# Patient Record
Sex: Female | Born: 1985 | Race: Black or African American | Hispanic: No | Marital: Single | Smoking: Current every day smoker
Health system: Southern US, Community
[De-identification: ages and names within clinical notes are randomized; demographics above are authoritative.]

## PROBLEM LIST (undated history)

## (undated) ENCOUNTER — Inpatient Hospital Stay (HOSPITAL_COMMUNITY): Payer: Self-pay

## (undated) DIAGNOSIS — A749 Chlamydial infection, unspecified: Secondary | ICD-10-CM

## (undated) DIAGNOSIS — J40 Bronchitis, not specified as acute or chronic: Secondary | ICD-10-CM

## (undated) DIAGNOSIS — R011 Cardiac murmur, unspecified: Secondary | ICD-10-CM

## (undated) DIAGNOSIS — A549 Gonococcal infection, unspecified: Secondary | ICD-10-CM

## (undated) DIAGNOSIS — O24419 Gestational diabetes mellitus in pregnancy, unspecified control: Secondary | ICD-10-CM

## (undated) HISTORY — PX: NO PAST SURGERIES: SHX2092

---
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
## 2003-01-02 ENCOUNTER — Inpatient Hospital Stay (HOSPITAL_COMMUNITY): Admission: AD | Admit: 2003-01-02 | Discharge: 2003-01-02 | Payer: Self-pay | Admitting: *Deleted

## 2003-01-02 ENCOUNTER — Emergency Department (HOSPITAL_COMMUNITY): Admission: EM | Admit: 2003-01-02 | Discharge: 2003-01-02 | Payer: Self-pay | Admitting: Emergency Medicine

## 2003-01-02 ENCOUNTER — Encounter: Payer: Self-pay | Admitting: Emergency Medicine

## 2003-06-10 ENCOUNTER — Emergency Department (HOSPITAL_COMMUNITY): Admission: EM | Admit: 2003-06-10 | Discharge: 2003-06-10 | Payer: Self-pay | Admitting: Emergency Medicine

## 2003-07-25 ENCOUNTER — Ambulatory Visit (HOSPITAL_COMMUNITY): Admission: RE | Admit: 2003-07-25 | Discharge: 2003-07-25 | Payer: Self-pay | Admitting: Obstetrics and Gynecology

## 2003-09-18 ENCOUNTER — Emergency Department (HOSPITAL_COMMUNITY): Admission: EM | Admit: 2003-09-18 | Discharge: 2003-09-19 | Payer: Self-pay | Admitting: Emergency Medicine

## 2003-10-08 ENCOUNTER — Emergency Department (HOSPITAL_COMMUNITY): Admission: EM | Admit: 2003-10-08 | Discharge: 2003-10-08 | Payer: Self-pay | Admitting: Emergency Medicine

## 2003-10-26 ENCOUNTER — Inpatient Hospital Stay (HOSPITAL_COMMUNITY): Admission: AD | Admit: 2003-10-26 | Discharge: 2003-10-27 | Payer: Self-pay | Admitting: Obstetrics & Gynecology

## 2003-11-11 ENCOUNTER — Inpatient Hospital Stay (HOSPITAL_COMMUNITY): Admission: AD | Admit: 2003-11-11 | Discharge: 2003-11-11 | Payer: Self-pay | Admitting: *Deleted

## 2003-11-16 ENCOUNTER — Inpatient Hospital Stay (HOSPITAL_COMMUNITY): Admission: AD | Admit: 2003-11-16 | Discharge: 2003-11-16 | Payer: Self-pay | Admitting: Obstetrics and Gynecology

## 2003-12-02 ENCOUNTER — Encounter: Admission: RE | Admit: 2003-12-02 | Discharge: 2003-12-02 | Payer: Self-pay | Admitting: *Deleted

## 2003-12-02 ENCOUNTER — Ambulatory Visit (HOSPITAL_COMMUNITY): Admission: RE | Admit: 2003-12-02 | Discharge: 2003-12-02 | Payer: Self-pay | Admitting: *Deleted

## 2003-12-04 ENCOUNTER — Inpatient Hospital Stay (HOSPITAL_COMMUNITY): Admission: AD | Admit: 2003-12-04 | Discharge: 2003-12-04 | Payer: Self-pay | Admitting: Obstetrics and Gynecology

## 2003-12-06 ENCOUNTER — Inpatient Hospital Stay (HOSPITAL_COMMUNITY): Admission: AD | Admit: 2003-12-06 | Discharge: 2003-12-08 | Payer: Self-pay | Admitting: Family Medicine

## 2004-04-18 ENCOUNTER — Emergency Department (HOSPITAL_COMMUNITY): Admission: EM | Admit: 2004-04-18 | Discharge: 2004-04-18 | Payer: Self-pay | Admitting: Emergency Medicine

## 2004-06-20 ENCOUNTER — Emergency Department (HOSPITAL_COMMUNITY): Admission: EM | Admit: 2004-06-20 | Discharge: 2004-06-20 | Payer: Self-pay | Admitting: Emergency Medicine

## 2004-12-19 ENCOUNTER — Emergency Department (HOSPITAL_COMMUNITY): Admission: EM | Admit: 2004-12-19 | Discharge: 2004-12-19 | Payer: Self-pay | Admitting: Emergency Medicine

## 2006-01-21 ENCOUNTER — Inpatient Hospital Stay (HOSPITAL_COMMUNITY): Admission: AD | Admit: 2006-01-21 | Discharge: 2006-01-21 | Payer: Self-pay | Admitting: Family Medicine

## 2006-03-24 ENCOUNTER — Inpatient Hospital Stay (HOSPITAL_COMMUNITY): Admission: AD | Admit: 2006-03-24 | Discharge: 2006-03-25 | Payer: Self-pay | Admitting: Gynecology

## 2006-04-20 ENCOUNTER — Inpatient Hospital Stay (HOSPITAL_COMMUNITY): Admission: AD | Admit: 2006-04-20 | Discharge: 2006-04-20 | Payer: Self-pay | Admitting: Gynecology

## 2006-05-16 ENCOUNTER — Ambulatory Visit (HOSPITAL_COMMUNITY): Admission: RE | Admit: 2006-05-16 | Discharge: 2006-05-16 | Payer: Self-pay | Admitting: Gynecology

## 2006-06-02 ENCOUNTER — Ambulatory Visit: Payer: Self-pay | Admitting: Obstetrics & Gynecology

## 2006-06-11 ENCOUNTER — Ambulatory Visit: Payer: Self-pay | Admitting: Obstetrics and Gynecology

## 2006-07-03 ENCOUNTER — Ambulatory Visit: Payer: Self-pay | Admitting: Obstetrics & Gynecology

## 2006-07-10 ENCOUNTER — Ambulatory Visit: Payer: Self-pay | Admitting: *Deleted

## 2006-07-21 ENCOUNTER — Ambulatory Visit: Payer: Self-pay | Admitting: *Deleted

## 2006-08-11 ENCOUNTER — Ambulatory Visit: Payer: Self-pay | Admitting: Obstetrics & Gynecology

## 2006-08-19 ENCOUNTER — Ambulatory Visit: Payer: Self-pay | Admitting: Obstetrics and Gynecology

## 2006-08-19 ENCOUNTER — Inpatient Hospital Stay (HOSPITAL_COMMUNITY): Admission: AD | Admit: 2006-08-19 | Discharge: 2006-08-21 | Payer: Self-pay | Admitting: Gynecology

## 2007-02-20 ENCOUNTER — Emergency Department (HOSPITAL_COMMUNITY): Admission: EM | Admit: 2007-02-20 | Discharge: 2007-02-20 | Payer: Self-pay | Admitting: Family Medicine

## 2007-04-17 ENCOUNTER — Emergency Department (HOSPITAL_COMMUNITY): Admission: EM | Admit: 2007-04-17 | Discharge: 2007-04-17 | Payer: Self-pay | Admitting: Emergency Medicine

## 2007-06-29 ENCOUNTER — Emergency Department (HOSPITAL_COMMUNITY): Admission: EM | Admit: 2007-06-29 | Discharge: 2007-06-29 | Payer: Self-pay | Admitting: Family Medicine

## 2007-07-19 ENCOUNTER — Emergency Department (HOSPITAL_COMMUNITY): Admission: EM | Admit: 2007-07-19 | Discharge: 2007-07-19 | Payer: Self-pay | Admitting: Emergency Medicine

## 2007-08-15 ENCOUNTER — Inpatient Hospital Stay (HOSPITAL_COMMUNITY): Admission: AD | Admit: 2007-08-15 | Discharge: 2007-08-15 | Payer: Self-pay | Admitting: Obstetrics & Gynecology

## 2007-11-03 ENCOUNTER — Emergency Department (HOSPITAL_COMMUNITY): Admission: EM | Admit: 2007-11-03 | Discharge: 2007-11-03 | Payer: Self-pay | Admitting: Emergency Medicine

## 2007-11-27 ENCOUNTER — Ambulatory Visit: Payer: Self-pay | Admitting: Nurse Practitioner

## 2007-11-27 DIAGNOSIS — F17200 Nicotine dependence, unspecified, uncomplicated: Secondary | ICD-10-CM | POA: Insufficient documentation

## 2007-11-27 DIAGNOSIS — M25569 Pain in unspecified knee: Secondary | ICD-10-CM

## 2007-11-30 ENCOUNTER — Ambulatory Visit: Payer: Self-pay | Admitting: Nurse Practitioner

## 2008-05-06 ENCOUNTER — Emergency Department (HOSPITAL_COMMUNITY): Admission: EM | Admit: 2008-05-06 | Discharge: 2008-05-06 | Payer: Self-pay | Admitting: Family Medicine

## 2009-08-15 IMAGING — US US OB COMP LESS 14 WK
1 series · 14 of 28 positions shown · non-contrast
Comparison: none

CLINICAL DATA: 20-year-old female with stomach pain, pregnant. 
 OBSTETRICAL ULTRASOUND <14 WKS AND TRANSVAGINAL OB US:
TECHNIQUE: Both transabdominal and transvaginal ultrasound examinations were performed for complete evaluation of the gestation as well as the maternal uterus, adnexal regions, and pelvic cul-de-sac.

[Series 1: unknown · 0.32mm/px · 14 of 62 slices shown]
[im 3/62]
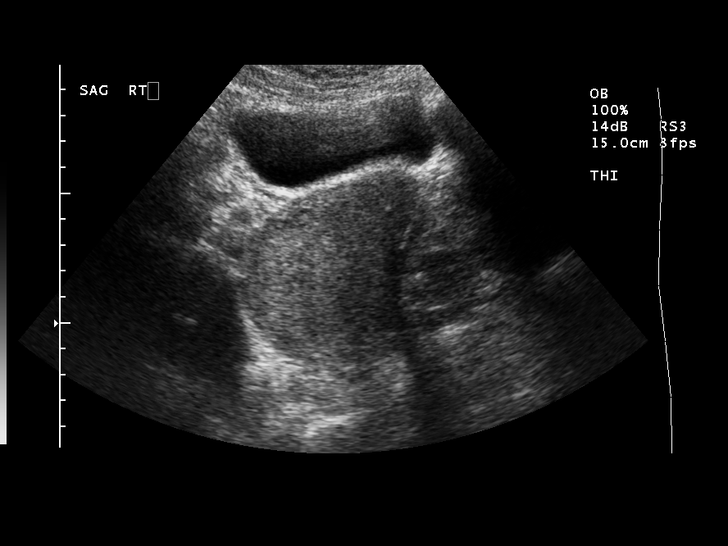
[im 7/62]
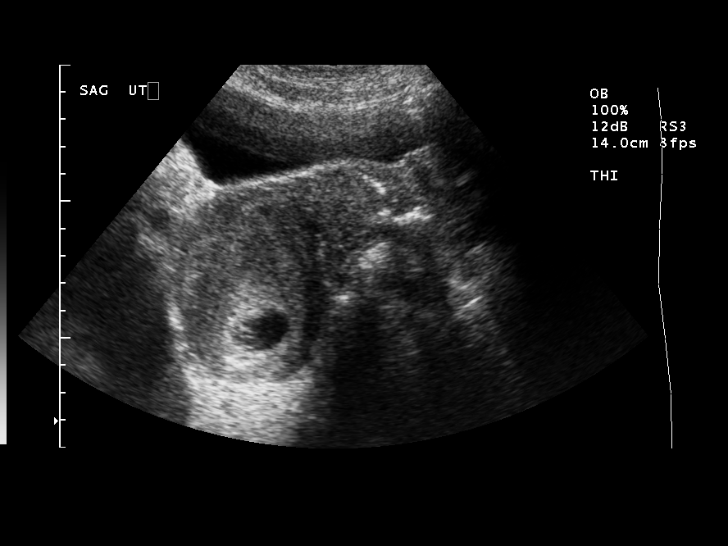
[im 12/62]
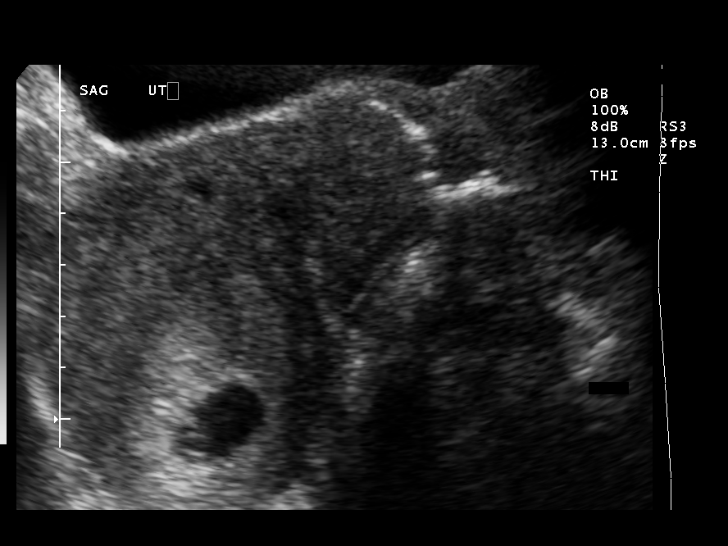
[im 16/62]
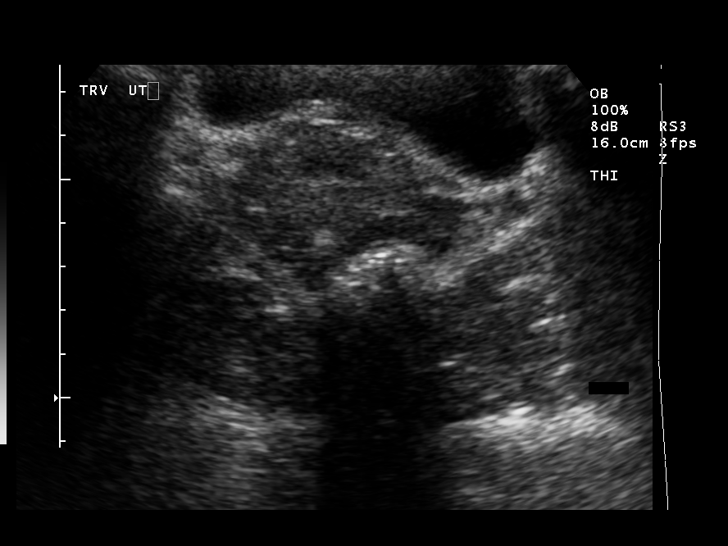
[im 21/62]
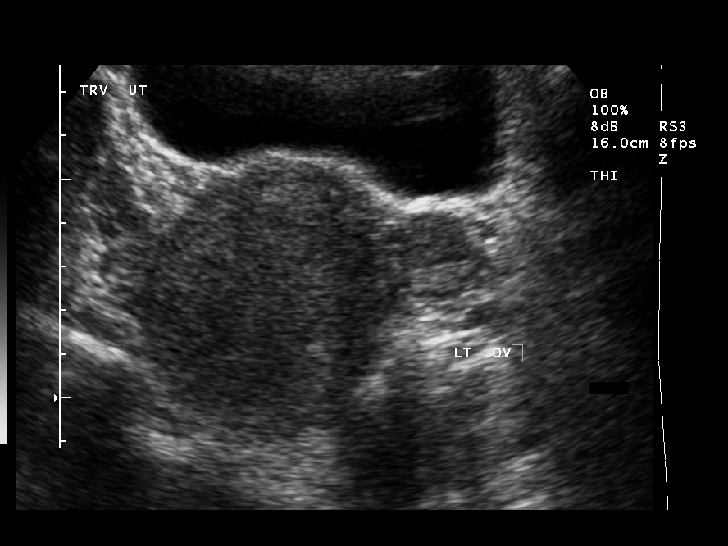
[im 25/62]
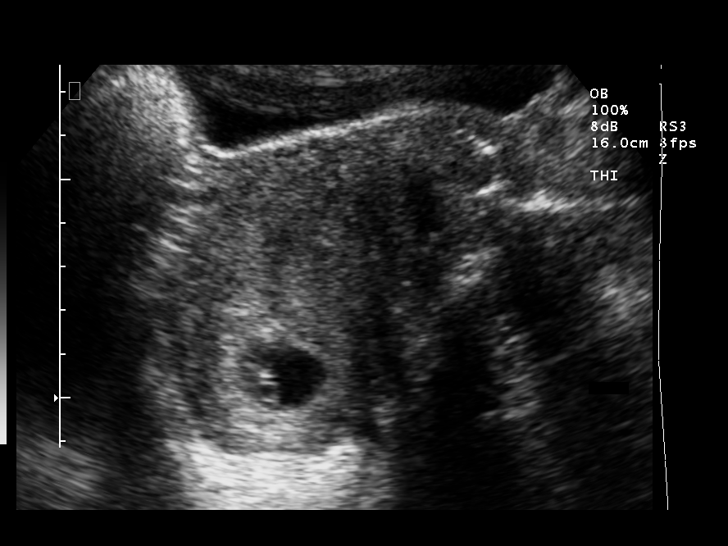
[im 30/62]
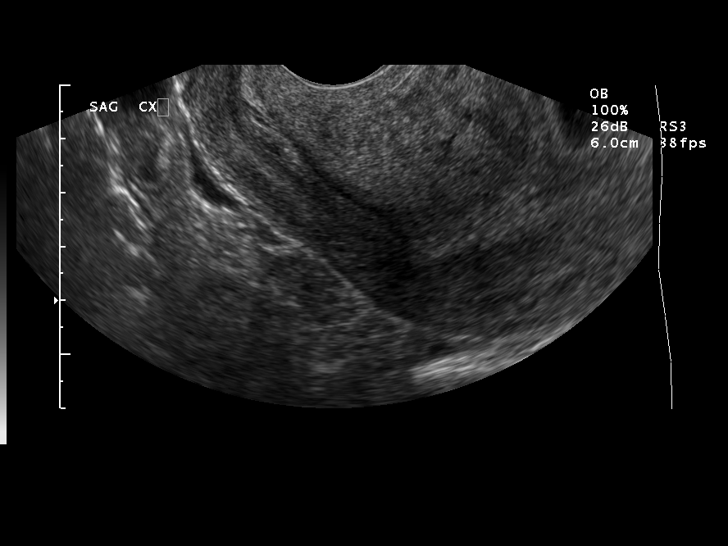
[im 34/62]
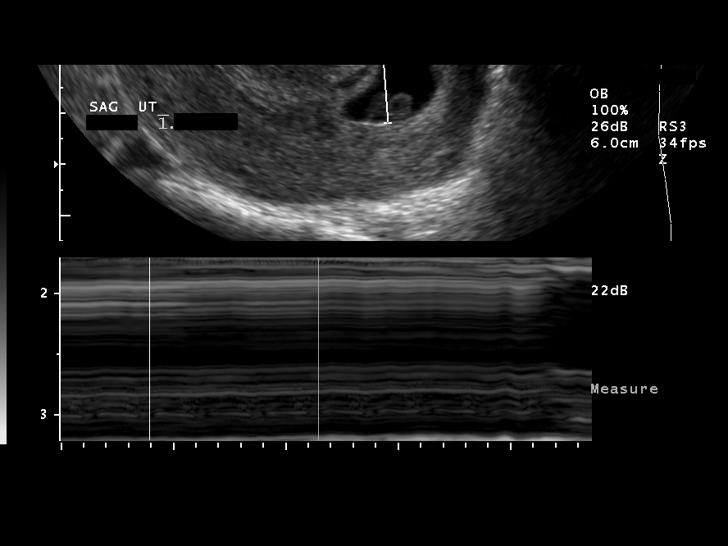
[im 39/62]
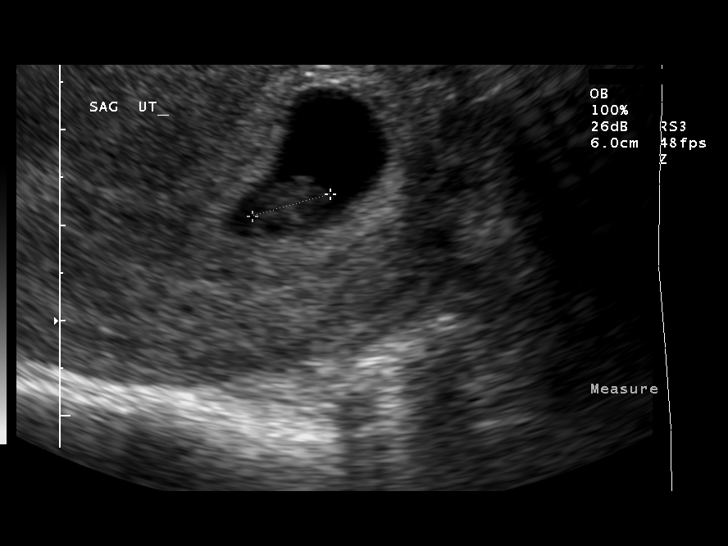
[im 43/62]
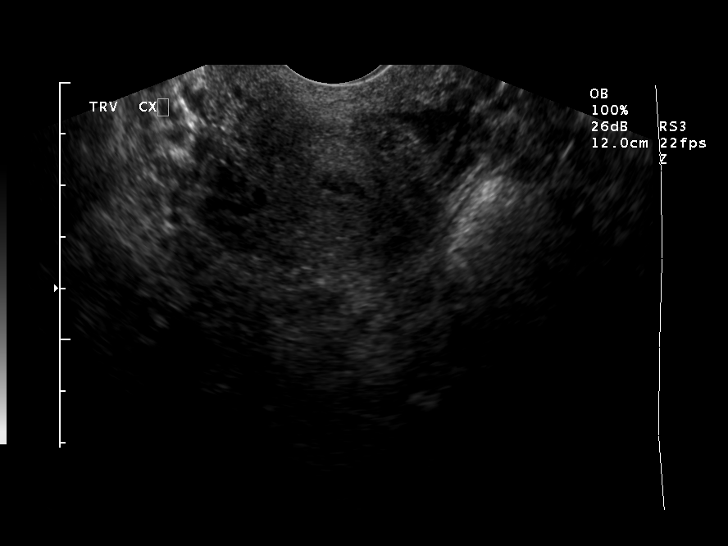
[im 48/62]
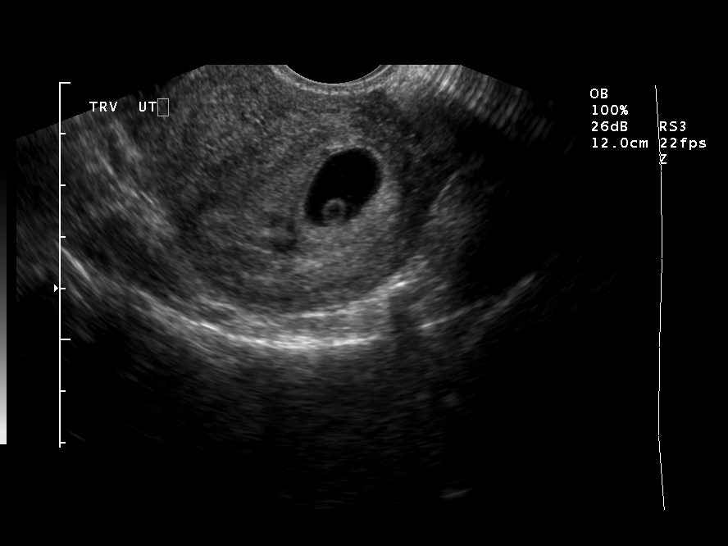
[im 52/62]
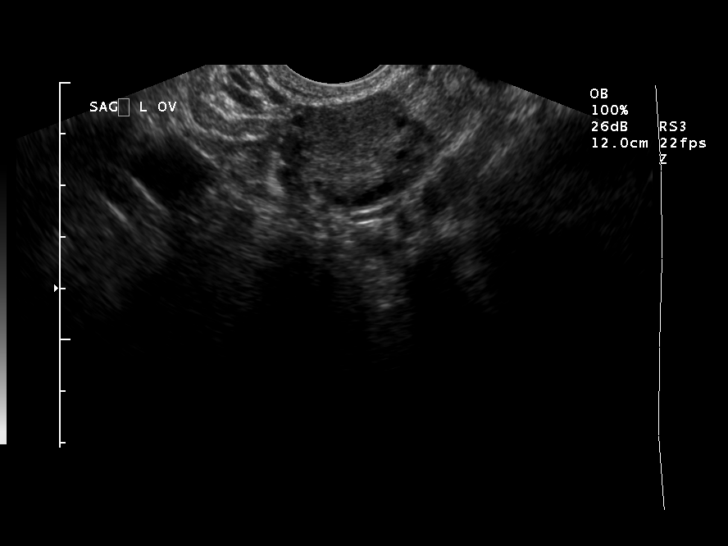
[im 57/62]
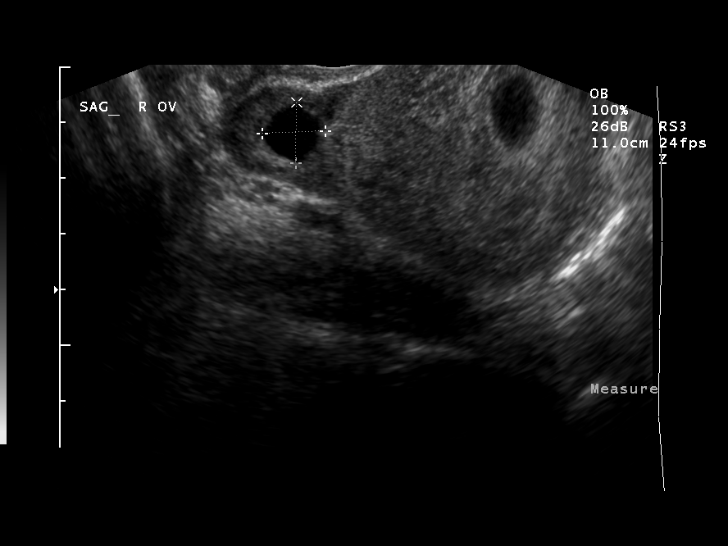
[im 62/62]
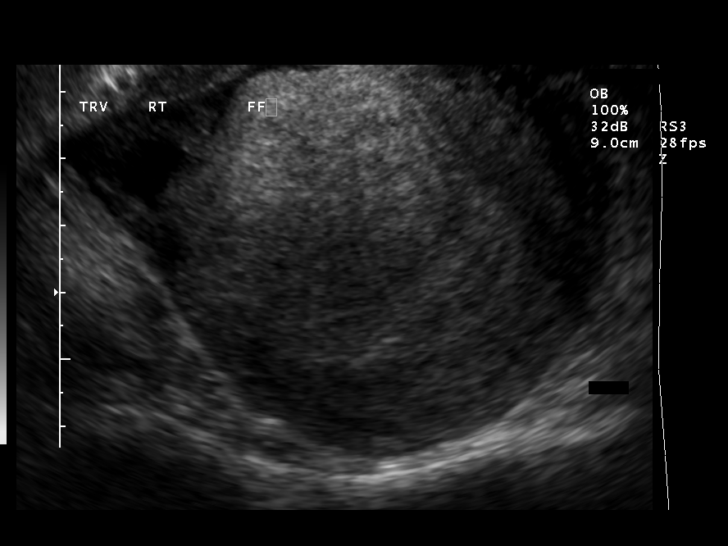

[14 of 28 positions shown; findings below may reference images not displayed]

FINDINGS: There is a single intrauterine gestational sac with a single live embryo.  There is a yolk sac present.  There is cardiac activity present with heart rate equal 121 beats per minute.  
 Crown rump length equals 8.0 mm, which calculates to an estimated gestational age of 6 weeks 5 days.  
 No evidence of subchorionic hemorrhage. 
 There is a small cyst in the right ovary measuring 1.1 cm consistent with corpus luteal cyst. 
 Left ovary is normal. 
 Trace amount of free fluid in the anterior cul-de-sac on the right.
IMPRESSION: Single live intrauterine gestational sac with live embryo and normal cardiac activity.

## 2010-07-06 ENCOUNTER — Emergency Department (HOSPITAL_COMMUNITY)
Admission: EM | Admit: 2010-07-06 | Discharge: 2010-07-06 | Payer: Self-pay | Source: Home / Self Care | Admitting: Family Medicine

## 2010-08-26 ENCOUNTER — Encounter: Payer: Self-pay | Admitting: *Deleted

## 2010-10-11 ENCOUNTER — Encounter (HOSPITAL_COMMUNITY): Payer: Self-pay | Admitting: Radiology

## 2010-10-11 ENCOUNTER — Inpatient Hospital Stay (HOSPITAL_COMMUNITY): Payer: Medicaid Other

## 2010-10-11 ENCOUNTER — Inpatient Hospital Stay (HOSPITAL_COMMUNITY)
Admission: AD | Admit: 2010-10-11 | Discharge: 2010-10-11 | Disposition: A | Discharge status: Home / Self Care | Payer: Medicaid Other | Source: Ambulatory Visit | Attending: Obstetrics & Gynecology | Admitting: Obstetrics & Gynecology

## 2010-10-11 DIAGNOSIS — O99891 Other specified diseases and conditions complicating pregnancy: Secondary | ICD-10-CM | POA: Insufficient documentation

## 2010-10-11 DIAGNOSIS — R109 Unspecified abdominal pain: Secondary | ICD-10-CM

## 2010-10-11 DIAGNOSIS — M533 Sacrococcygeal disorders, not elsewhere classified: Secondary | ICD-10-CM | POA: Insufficient documentation

## 2010-10-11 DIAGNOSIS — O9989 Other specified diseases and conditions complicating pregnancy, childbirth and the puerperium: Secondary | ICD-10-CM

## 2010-10-11 LAB — COMPREHENSIVE METABOLIC PANEL
ALT: 15 U/L (ref 0–35)
Albumin: 3.6 g/dL (ref 3.5–5.2)
CO2: 24 mEq/L (ref 19–32)
Calcium: 8.9 mg/dL (ref 8.4–10.5)
Chloride: 104 mEq/L (ref 96–112)
Creatinine, Ser: 0.57 mg/dL (ref 0.4–1.2)
Glucose, Bld: 82 mg/dL (ref 70–99)
Potassium: 3.4 mEq/L — ABNORMAL LOW (ref 3.5–5.1)
Total Protein: 6.5 g/dL (ref 6.0–8.3)

## 2010-10-11 LAB — URINALYSIS, ROUTINE W REFLEX MICROSCOPIC
Bilirubin Urine: NEGATIVE
Glucose, UA: NEGATIVE mg/dL
Ketones, ur: NEGATIVE mg/dL
Protein, ur: NEGATIVE mg/dL
pH: 5.5 (ref 5.0–8.0)

## 2010-10-11 LAB — CBC
Hemoglobin: 11.9 g/dL — ABNORMAL LOW (ref 12.0–15.0)
MCHC: 33.4 g/dL (ref 30.0–36.0)
MCV: 86.2 fL (ref 78.0–100.0)
Platelets: 244 10*3/uL (ref 150–400)
RBC: 4.13 MIL/uL (ref 3.87–5.11)
WBC: 9.3 10*3/uL (ref 4.0–10.5)

## 2010-10-11 LAB — WET PREP, GENITAL

## 2010-10-11 LAB — POCT PREGNANCY, URINE: Preg Test, Ur: POSITIVE

## 2010-10-12 LAB — GC/CHLAMYDIA PROBE AMP, GENITAL: Chlamydia, DNA Probe: NEGATIVE

## 2010-10-15 LAB — POCT PREGNANCY, URINE: Preg Test, Ur: NEGATIVE

## 2010-12-07 ENCOUNTER — Inpatient Hospital Stay (HOSPITAL_COMMUNITY)
Admission: AD | Admit: 2010-12-07 | Discharge: 2010-12-07 | Disposition: A | Discharge status: Home / Self Care | Payer: Medicaid Other | Source: Ambulatory Visit | Attending: Obstetrics and Gynecology | Admitting: Obstetrics and Gynecology

## 2010-12-07 DIAGNOSIS — O9989 Other specified diseases and conditions complicating pregnancy, childbirth and the puerperium: Secondary | ICD-10-CM

## 2010-12-07 DIAGNOSIS — R109 Unspecified abdominal pain: Secondary | ICD-10-CM

## 2010-12-07 DIAGNOSIS — L259 Unspecified contact dermatitis, unspecified cause: Secondary | ICD-10-CM

## 2010-12-07 DIAGNOSIS — O99891 Other specified diseases and conditions complicating pregnancy: Secondary | ICD-10-CM | POA: Insufficient documentation

## 2010-12-07 LAB — URINE MICROSCOPIC-ADD ON

## 2010-12-07 LAB — URINALYSIS, ROUTINE W REFLEX MICROSCOPIC
Nitrite: NEGATIVE
Protein, ur: NEGATIVE mg/dL
Specific Gravity, Urine: 1.025 (ref 1.005–1.030)
Urobilinogen, UA: 0.2 mg/dL (ref 0.0–1.0)

## 2011-04-24 LAB — URINALYSIS, ROUTINE W REFLEX MICROSCOPIC
Bilirubin Urine: NEGATIVE
Glucose, UA: NEGATIVE
Hgb urine dipstick: NEGATIVE
Protein, ur: NEGATIVE
Urobilinogen, UA: 0.2

## 2011-04-24 LAB — WET PREP, GENITAL

## 2011-04-29 LAB — POCT CARDIAC MARKERS
CKMB, poc: <1 — ABNORMAL LOW
Operator id: 272551
Troponin i, poc: <0.05

## 2011-04-29 LAB — D-DIMER, QUANTITATIVE: D-Dimer, Quant: <0.22

## 2011-05-13 LAB — URINALYSIS, ROUTINE W REFLEX MICROSCOPIC
Bilirubin Urine: NEGATIVE
Hgb urine dipstick: NEGATIVE
Ketones, ur: 40 — AB
Nitrite: NEGATIVE
Urobilinogen, UA: 0.2
pH: 7

## 2011-05-13 LAB — I-STAT 8, (EC8 V) (CONVERTED LAB)
Acid-base deficit: 4 — ABNORMAL HIGH
BUN: 5 — ABNORMAL LOW
Bicarbonate: 20.6
Chloride: 106
Glucose, Bld: 75
HCT: 46
Hemoglobin: 15.6 — ABNORMAL HIGH
Operator id: 285491
Potassium: 3.6
Sodium: 135
TCO2: 22
pCO2, Ven: 34 — ABNORMAL LOW
pH, Ven: 7.39 — ABNORMAL HIGH

## 2011-05-13 LAB — DIFFERENTIAL
Eosinophils Absolute: 0.1 — ABNORMAL LOW
Eosinophils Relative: 1
Lymphocytes Relative: 30
Lymphs Abs: 2.1
Monocytes Absolute: 0.5
Monocytes Relative: 8

## 2011-05-13 LAB — RPR: RPR Ser Ql: NONREACTIVE

## 2011-05-13 LAB — CBC
HCT: 40.8
Hemoglobin: 13.7
MCV: 86.4
Platelets: 230
WBC: 7.1

## 2011-05-13 LAB — WET PREP, GENITAL
Trich, Wet Prep: NONE SEEN
WBC, Wet Prep HPF POC: NONE SEEN
Yeast Wet Prep HPF POC: NONE SEEN

## 2011-05-13 LAB — POCT I-STAT CREATININE
Creatinine, Ser: 0.8
Operator id: 285491

## 2011-05-13 LAB — POCT PREGNANCY, URINE: Operator id: 285491

## 2011-05-14 LAB — POCT PREGNANCY, URINE: Preg Test, Ur: POSITIVE

## 2011-05-17 LAB — POCT URINALYSIS DIP (DEVICE)
Bilirubin Urine: NEGATIVE
Hgb urine dipstick: NEGATIVE
Nitrite: NEGATIVE
Urobilinogen, UA: 0.2
pH: 5.5

## 2011-05-17 LAB — WET PREP, GENITAL
Clue Cells Wet Prep HPF POC: NONE SEEN
Trich, Wet Prep: NONE SEEN
Yeast Wet Prep HPF POC: NONE SEEN

## 2011-05-17 LAB — GC/CHLAMYDIA PROBE AMP, GENITAL: GC Probe Amp, Genital: POSITIVE — AB

## 2012-07-03 ENCOUNTER — Encounter (HOSPITAL_COMMUNITY): Payer: Self-pay | Admitting: Family

## 2012-07-03 ENCOUNTER — Inpatient Hospital Stay (HOSPITAL_COMMUNITY)
Admission: AD | Admit: 2012-07-03 | Discharge: 2012-07-03 | Disposition: A | Discharge status: Home / Self Care | Payer: Medicaid Other | Source: Ambulatory Visit | Attending: Family Medicine | Admitting: Family Medicine

## 2012-07-03 DIAGNOSIS — N912 Amenorrhea, unspecified: Secondary | ICD-10-CM | POA: Insufficient documentation

## 2012-07-03 DIAGNOSIS — Z3201 Encounter for pregnancy test, result positive: Secondary | ICD-10-CM | POA: Insufficient documentation

## 2012-07-03 DIAGNOSIS — Z331 Pregnant state, incidental: Secondary | ICD-10-CM

## 2012-07-03 HISTORY — DX: Chlamydial infection, unspecified: A74.9

## 2012-07-03 HISTORY — DX: Cardiac murmur, unspecified: R01.1

## 2012-07-03 HISTORY — DX: Gonococcal infection, unspecified: A54.9

## 2012-07-03 HISTORY — DX: Bronchitis, not specified as acute or chronic: J40

## 2012-07-03 LAB — URINALYSIS, ROUTINE W REFLEX MICROSCOPIC
Hgb urine dipstick: NEGATIVE
Ketones, ur: NEGATIVE mg/dL
Protein, ur: NEGATIVE mg/dL
Urobilinogen, UA: 0.2 mg/dL (ref 0.0–1.0)

## 2012-07-03 NOTE — MAU Note (Signed)
Pt reports LMP October 6. Has been having lower abdominal cramping x 3 days; denies bleeding or spotting.

## 2012-07-03 NOTE — MAU Provider Note (Signed)
Chart reviewed and agree with management and plan.  

## 2012-07-03 NOTE — MAU Provider Note (Signed)
  History     CSN: [id number]  Arrival date and time: 07/03/12 1157   First Provider Initiated Contact with Patient 07/03/12 1330      Chief Complaint  Patient presents with  . Amenorrhea   HPI  Paula Joseph is a 26 y.o. [id number] who presents today with some cramping and missed period. She states that the cramping has been similar to the cramping she has felt in early pregnancy before. She denies any vaginal bleeding.  She wanted to confirm the pregnancy. She has seen [facility] for prenatal care in the past. She is not sure if she is going to go back there. She would like a  letter for medicaid.   Past Medical History  Diagnosis Date  . Heart murmur   . Bronchitis   . Gonorrhea   . Chlamydia     Past Surgical History  Procedure Date  . No past surgeries     History reviewed. No pertinent family history.  History  Substance Use Topics  . Smoking status: Current Every Day Smoker -- 0.2 packs/day  . Smokeless tobacco: Not on file  . Alcohol Use: No    Allergies: No Known Allergies  No prescriptions prior to admission    Review of Systems  Constitutional: Negative for fever.  Eyes: Negative for blurred vision.  Gastrointestinal: Negative for nausea, vomiting, abdominal pain, diarrhea and constipation.  Genitourinary: Negative for dysuria and urgency.  Neurological: Negative for headaches.   Physical Exam   Blood pressure 134/94, pulse 76, temperature 97.6 F (36.4 C), temperature source Oral, resp. rate 16, height 5' 3.5" (1.613 m), weight 156 lb (70.761 kg), last menstrual period 05/10/2012, unknown if currently breastfeeding.  Physical Exam  Nursing note and vitals reviewed. Constitutional: She is oriented to person, place, and time. She appears well-developed and well-nourished.  Cardiovascular: Normal rate.   Respiratory: Effort normal.  GI: Soft. She exhibits no distension. There is no tenderness.  Neurological: She is alert  and oriented to person, place, and time.  Skin: Skin is warm and dry.  Psychiatric: She has a normal mood and affect.    MAU Course  Procedures  Bedside ultrasound shows small IUP with cardiac activity.   Assessment and Plan   1. Pregnancy as incidental finding    Letter given for medicaid First trimester danger signs reviewed.   Tawnya Crook 07/03/2012, 1:30 PM

## 2012-08-02 IMAGING — CR DG ANKLE COMPLETE 3+V*L*
3 series · 3 of 3 positions shown · non-contrast
Comparison: None.

CLINICAL DATA: Football injury with medial left ankle pain.

LEFT ANKLE COMPLETE - 3+ VIEW

[view not recorded (1 of 3)]
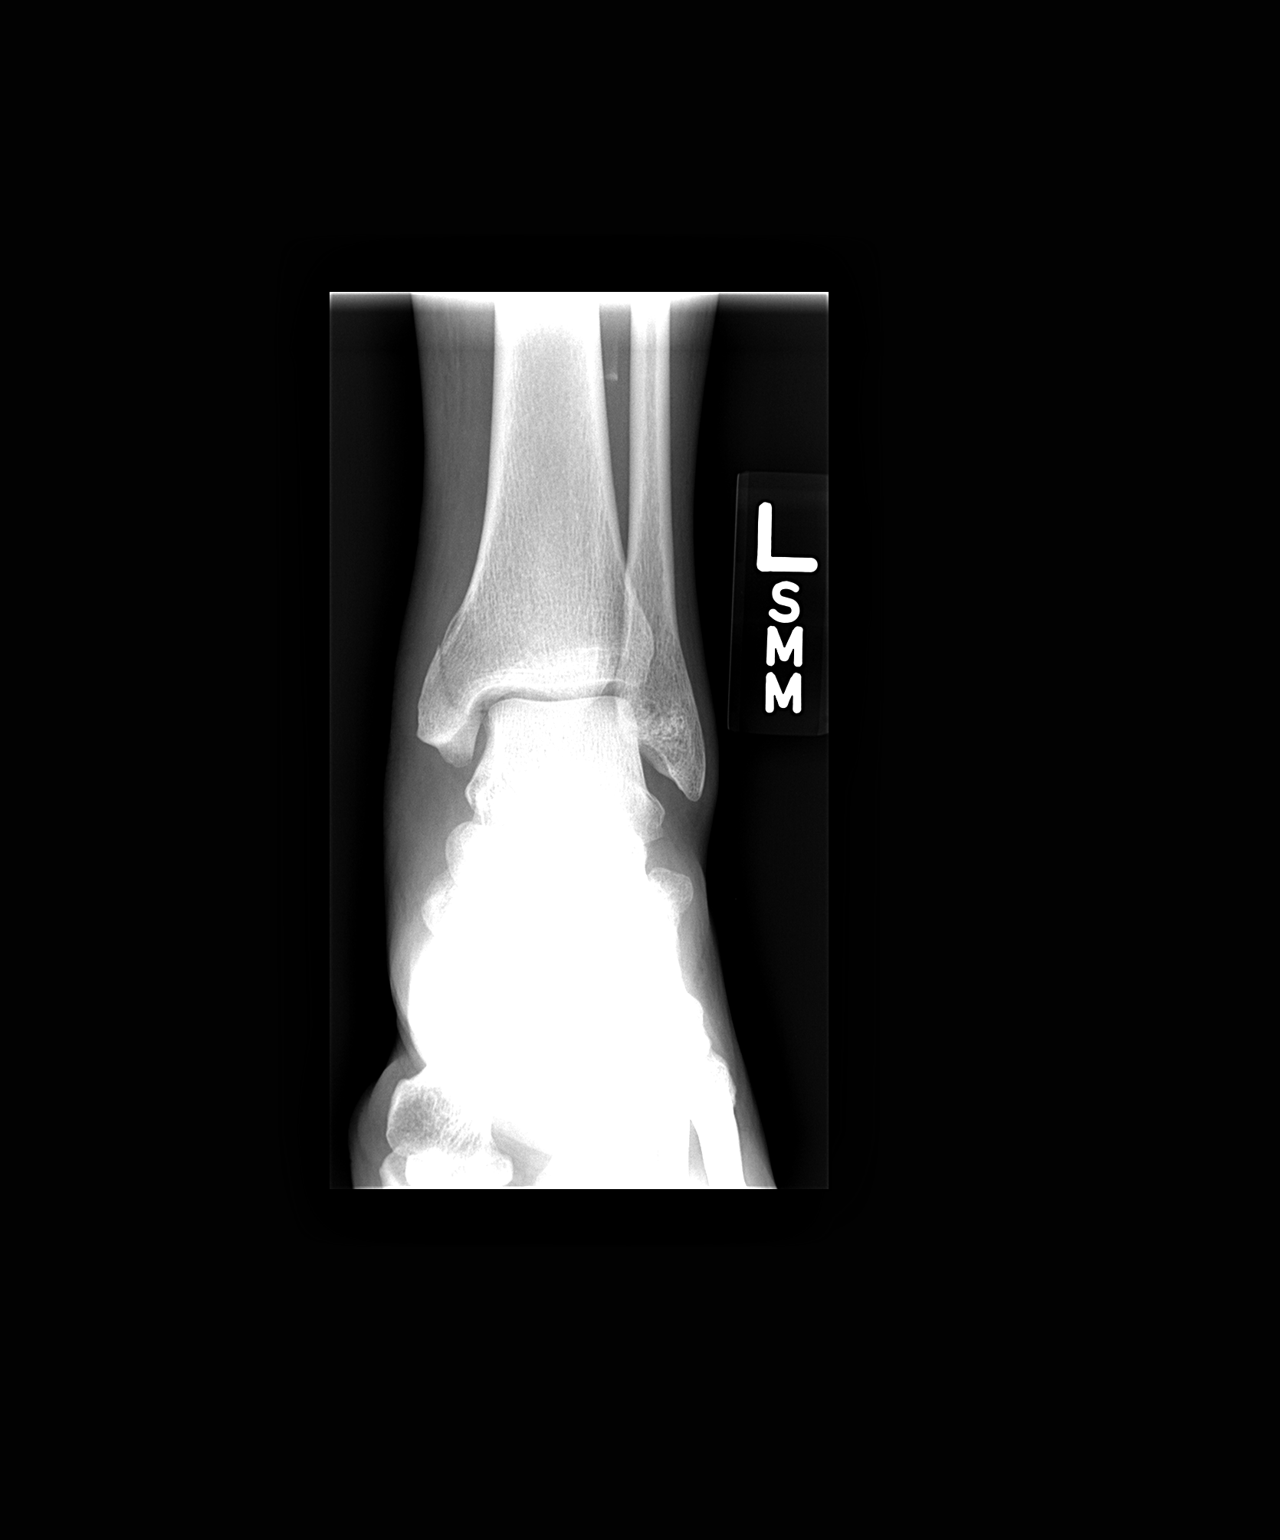

[view not recorded (2 of 3)]
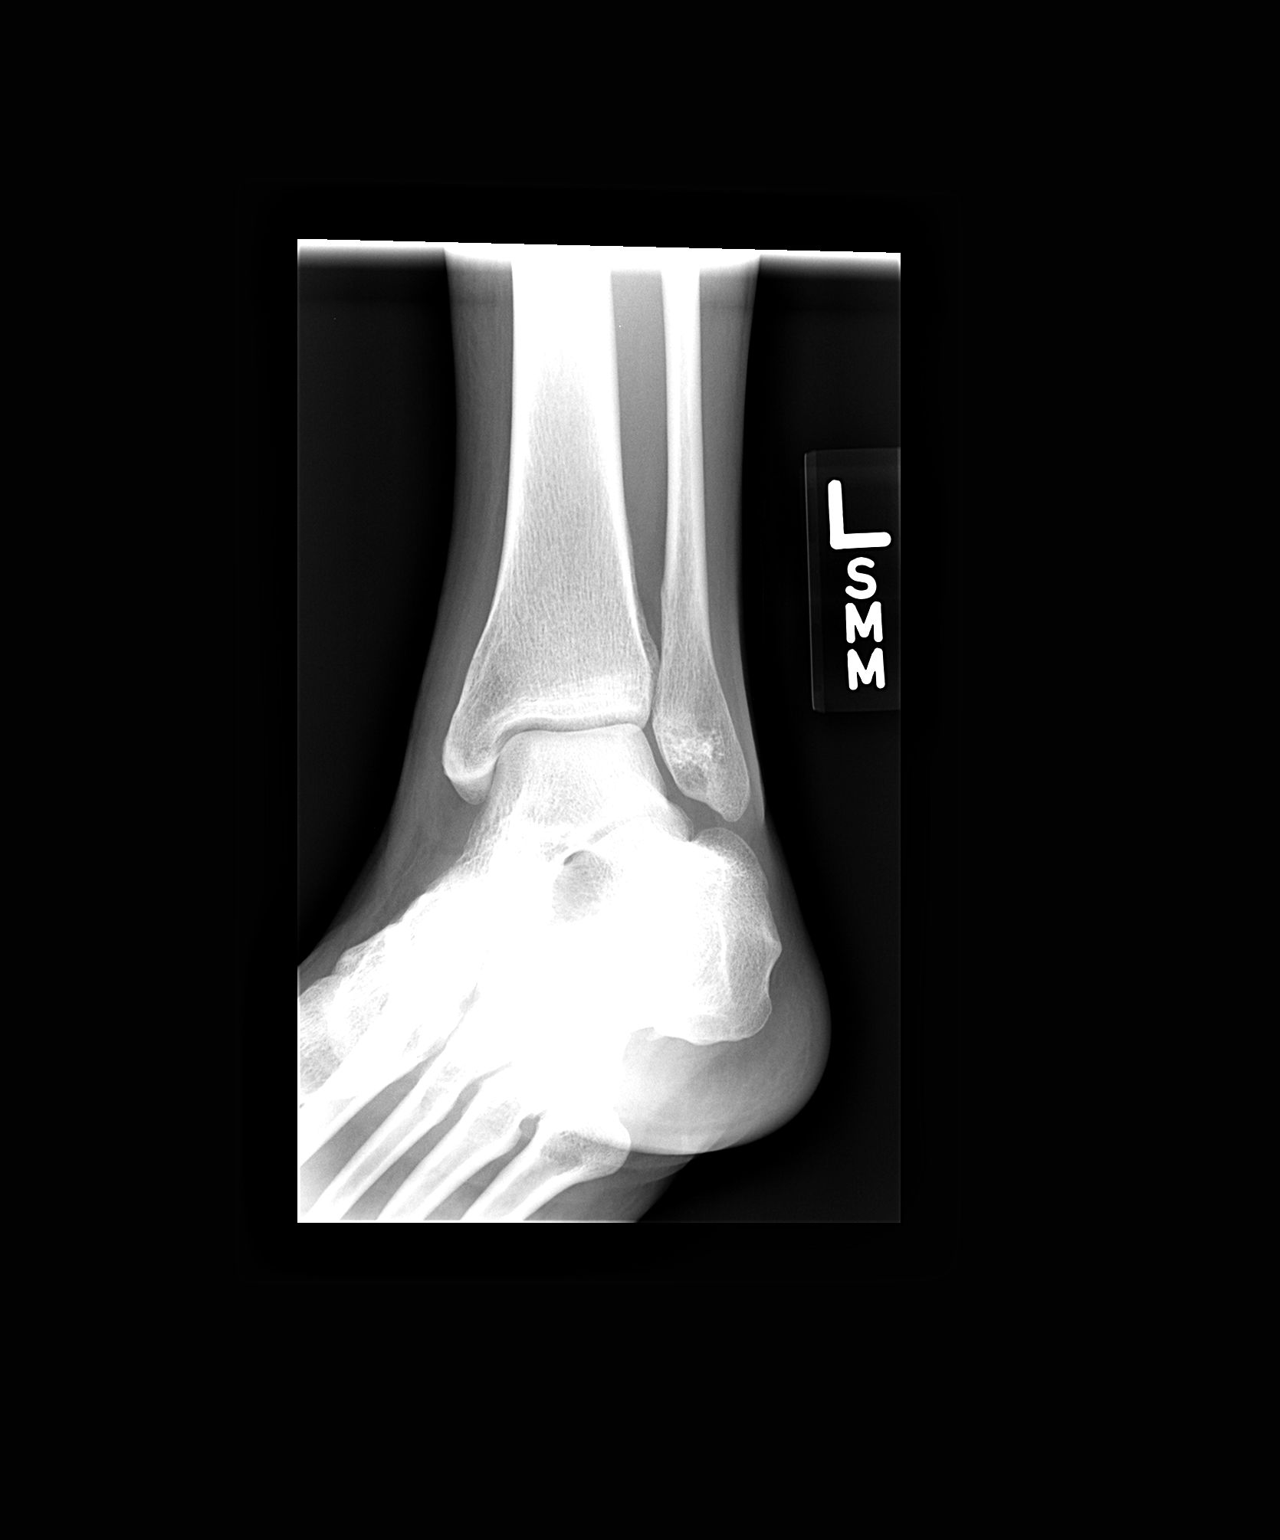

[view not recorded (3 of 3)]
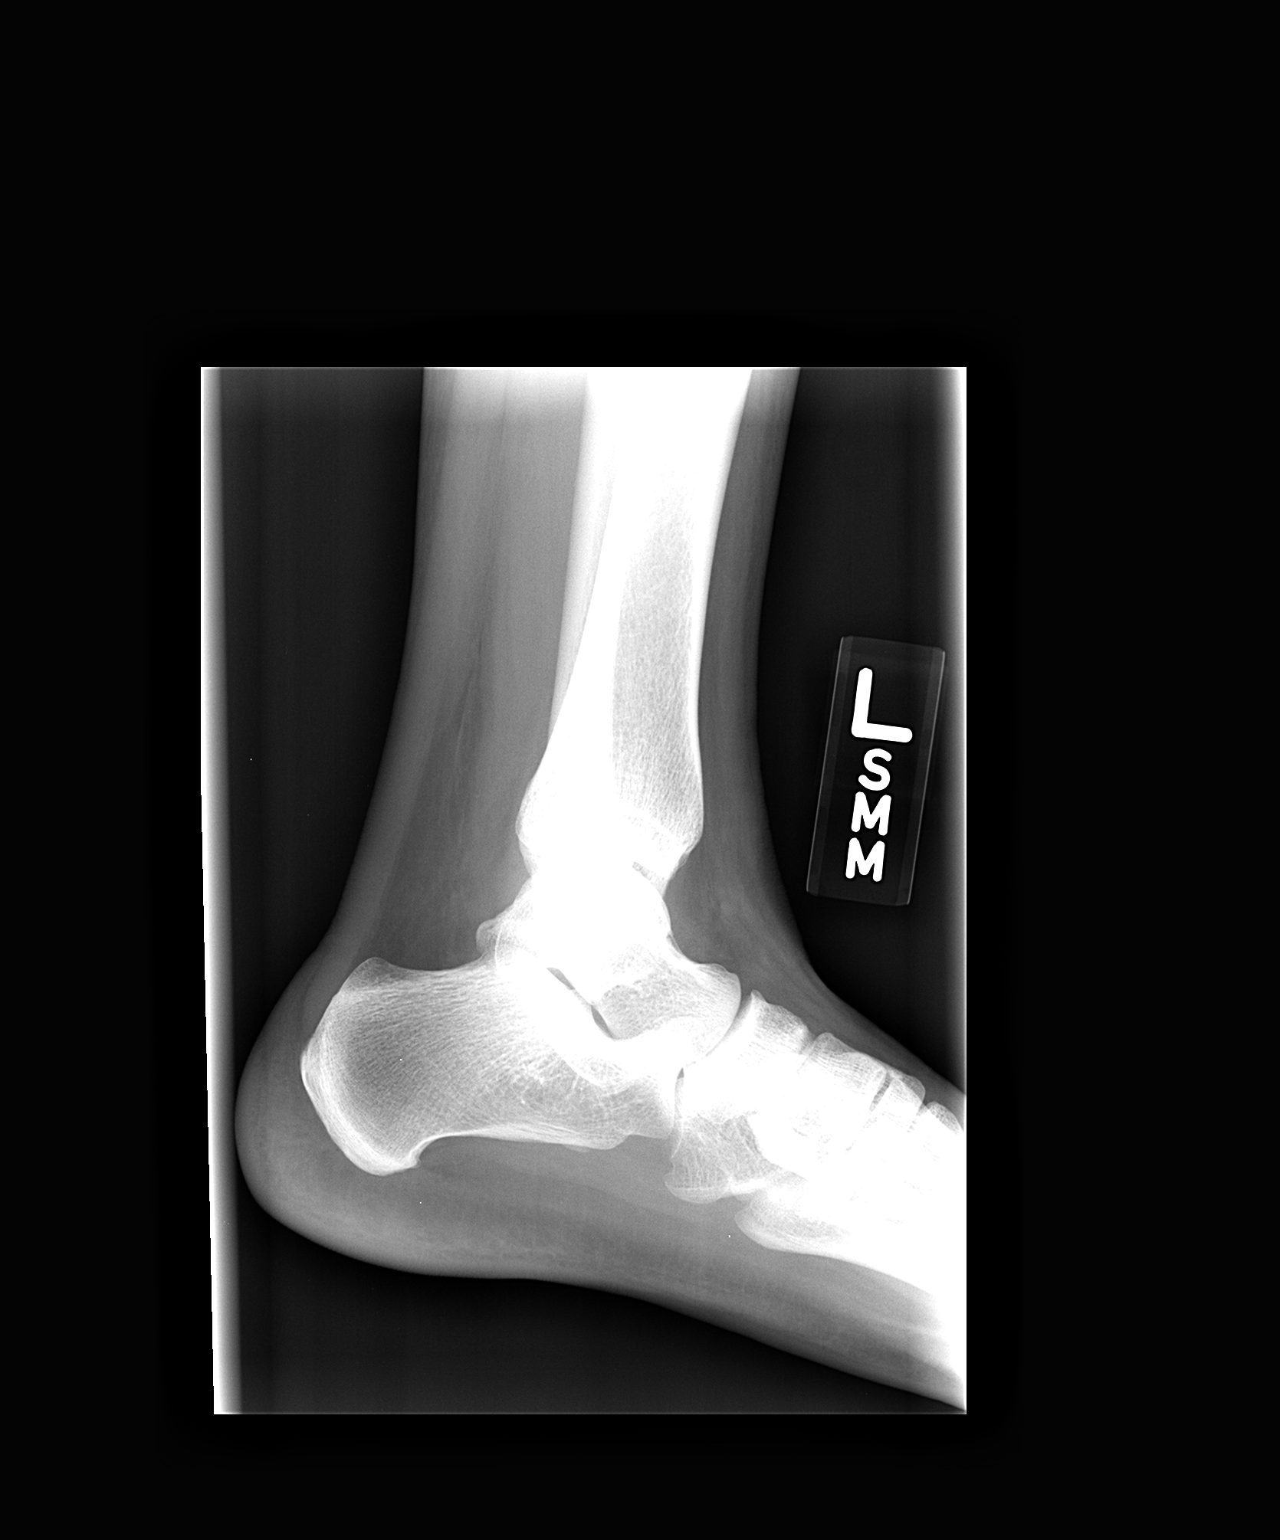

[3 of 3 positions shown; findings below may reference images not displayed]

FINDINGS: Nondisplaced oblique medial malleolar fracture is seen
with ankle mortise symmetric.  Slight medial soft tissue swelling
noted.  No other significant abnormalities seen.
IMPRESSION: 1.  Nondisplaced oblique medial malleolar fracture with medial soft
tissue swelling.
2.  Otherwise, negative.

## 2012-11-07 IMAGING — US US OB COMP LESS 14 WK
1 series · 14 of 28 positions shown · non-contrast
Comparison: none

[Series 1: us ob comp less 14 wks · 14 of 29 slices shown]
[im 2/29]
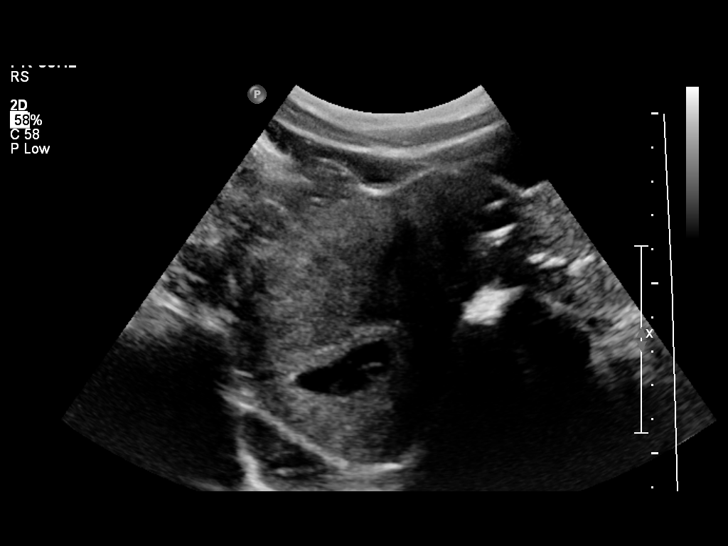
[im 4/29]
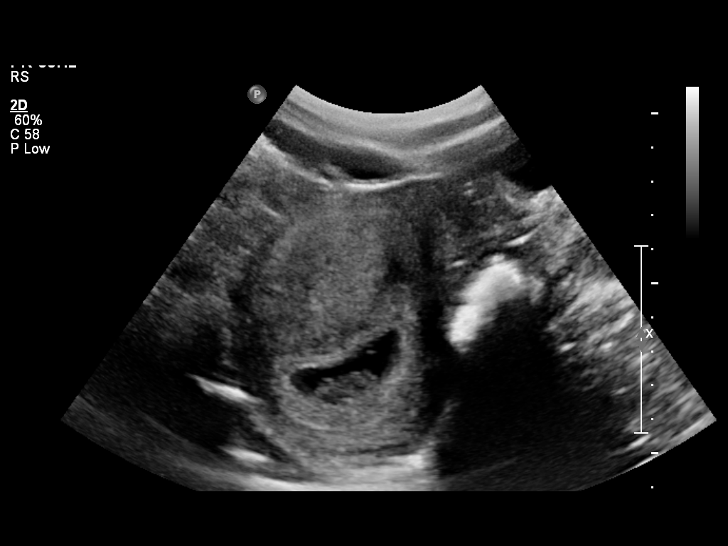
[im 6/29]
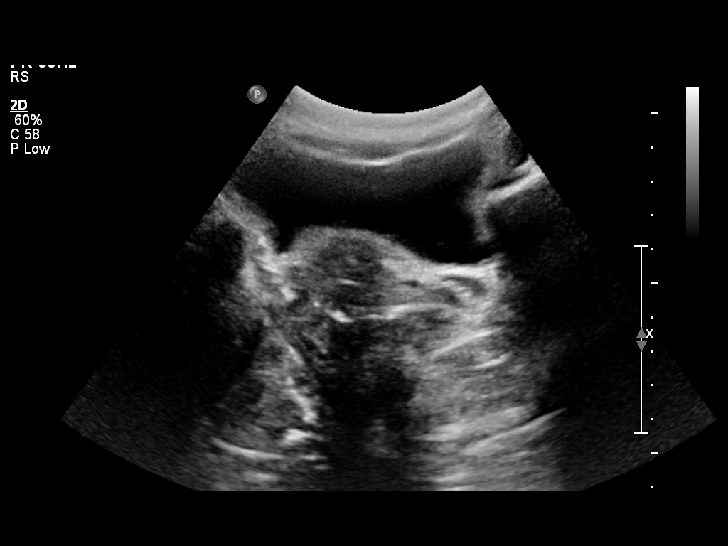
[im 8/29]
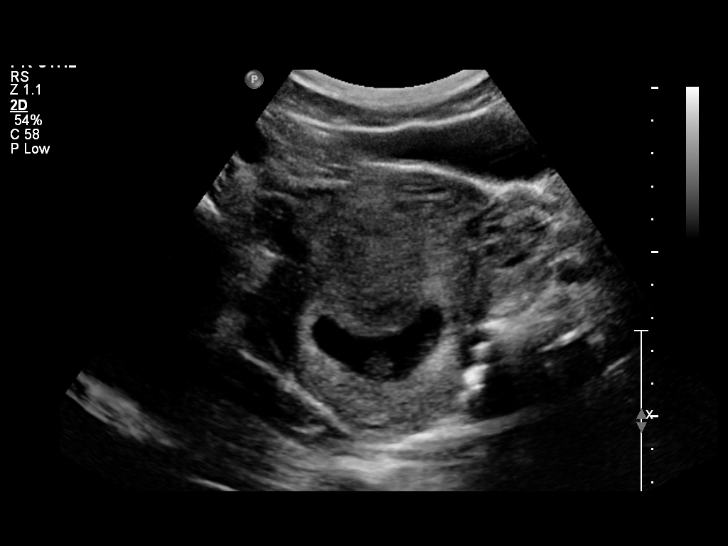
[im 10/29]
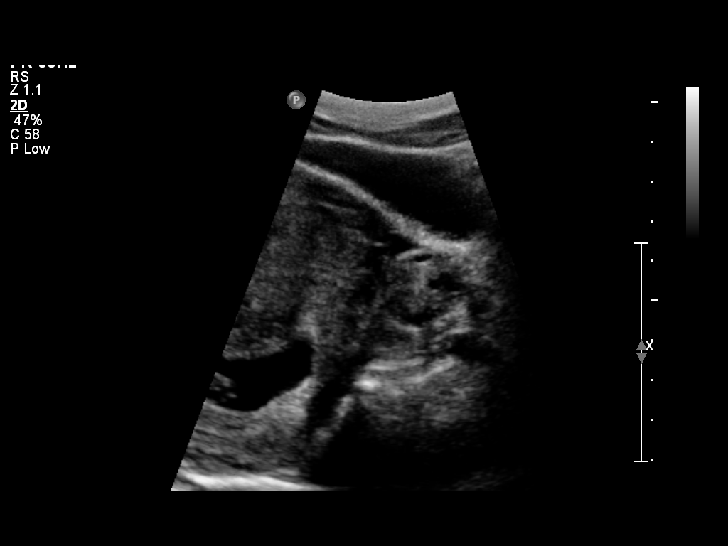
[im 12/29]
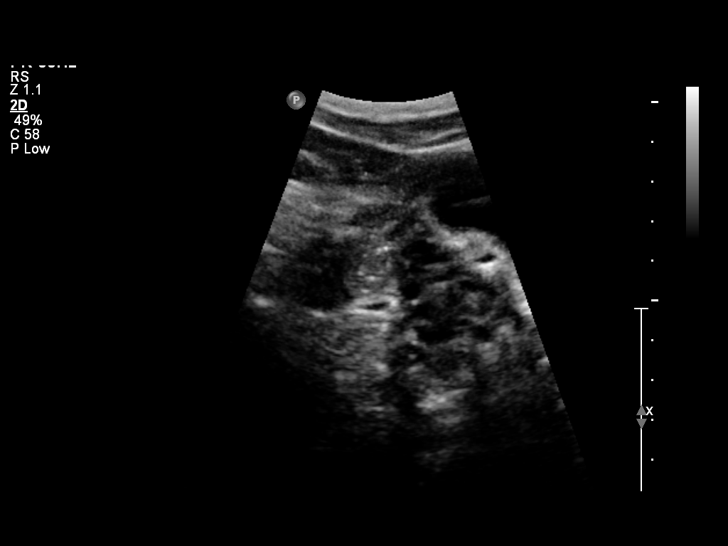
[im 14/29]
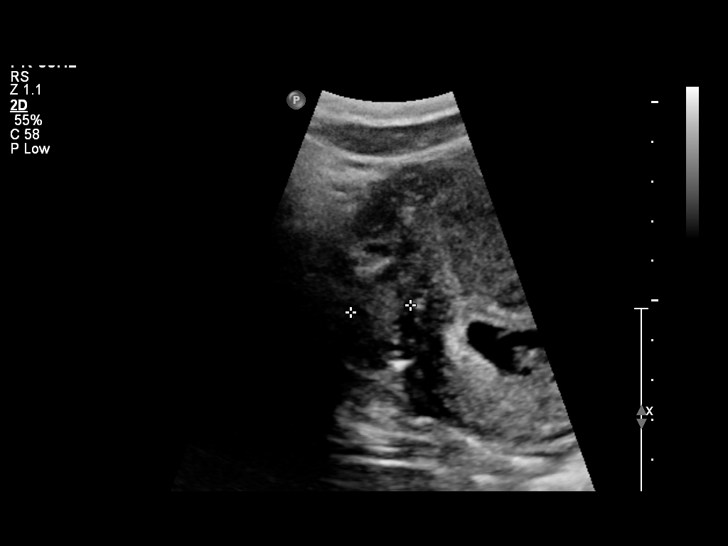
[im 16/29]
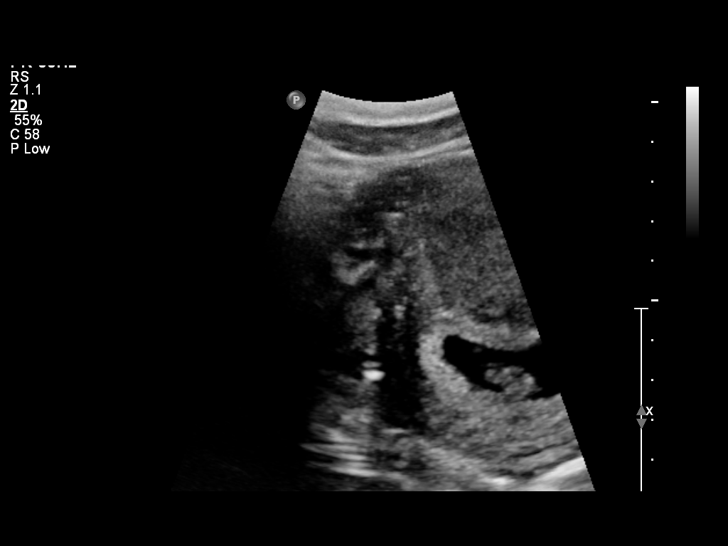
[im 18/29]
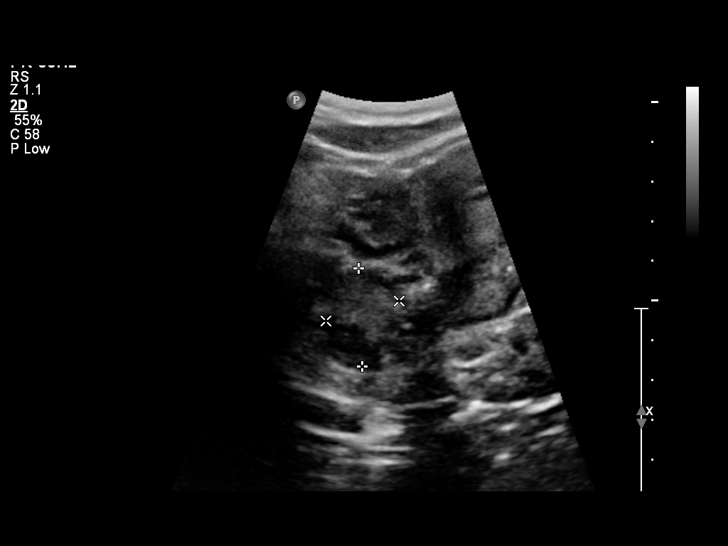
[im 20/29]
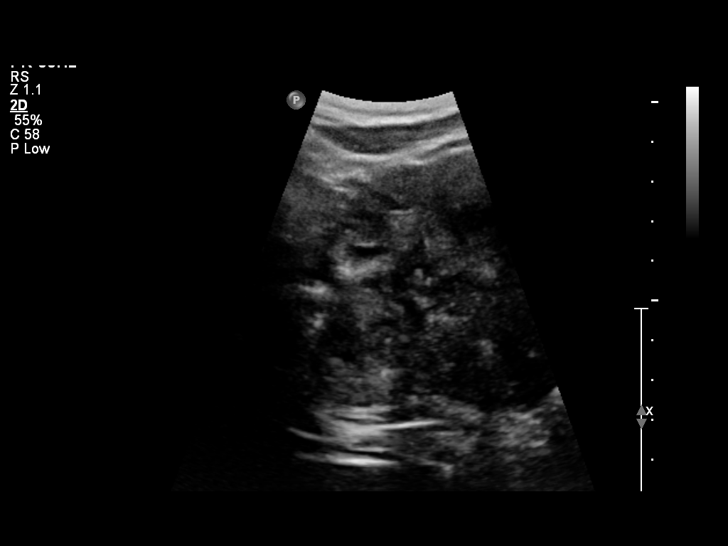
[im 22/29]
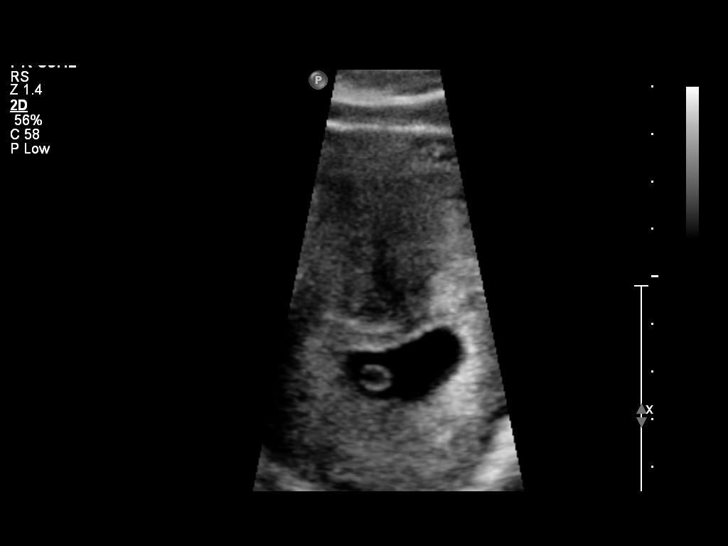
[im 24/29]
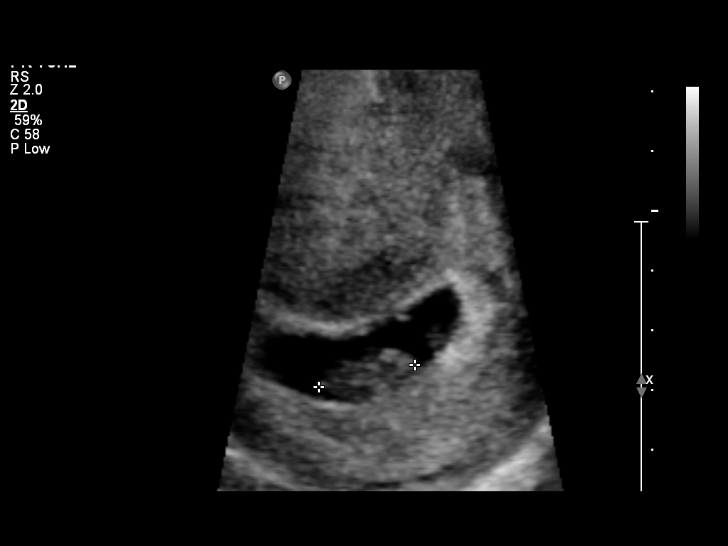
[im 26/29]
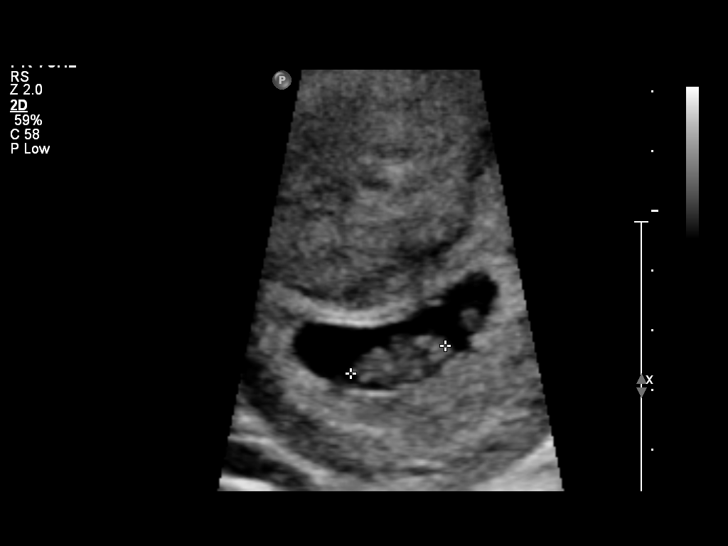
[im 29/29]
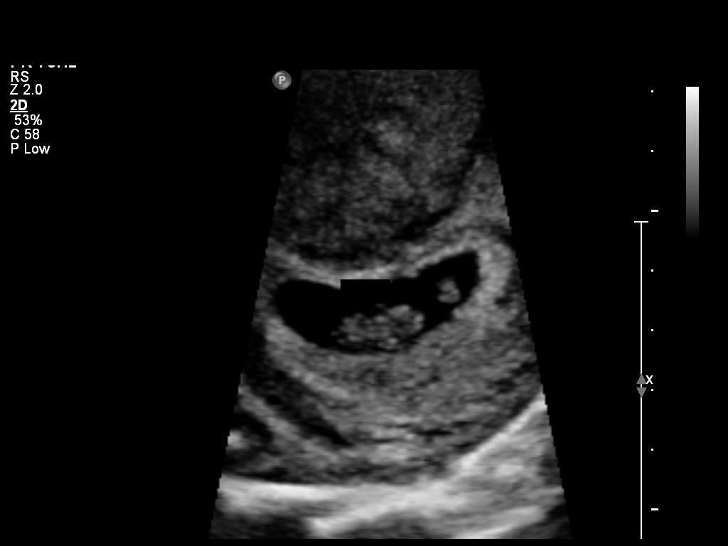

[14 of 28 positions shown; findings below may reference images not displayed]

OBSTETRICS REPORT
                      (Signed Final 10/11/2010 [DATE])

Procedures

 US OB COMP LESS 14 WKS                                76801.0
Indications

 Pain - RLQ
Fetal Evaluation

 Preg. Location:    Intrauterine
 Gest. Sac:         Intrauterine
 Yolk Sac:          Visualized
 Fetal Pole:        Visualized
 Fetal Heart Rate:  172                          bpm
 Cardiac Activity:  Observed
Biometry

 CRL:     16.6  mm     G. Age:  8w 0d                  EDD:    05/23/11
Gestational Age

 LMP:           9w 0d         Date:  08/09/10                 EDD:   05/16/11
 Best:          8w 0d      Det. By:  U/S C R L (10/11/10)     EDD:   05/23/11
Cervix Uterus Adnexa

 Cervix:       Normal appearance by transvaginal scan
 Left Ovary:    Within normal limits measuring 1.9 x 2.4 x 1.7 cm.
 Right Ovary:   Within normal limits measuring 1.5 x 2.5 x 1.9 cm.
                Small corpus luteum noted.

 Adnexa:     No abnormality visualized.

## 2013-05-08 ENCOUNTER — Encounter (HOSPITAL_COMMUNITY): Payer: Self-pay | Admitting: *Deleted

## 2014-01-10 ENCOUNTER — Inpatient Hospital Stay (HOSPITAL_COMMUNITY)
Admission: AD | Admit: 2014-01-10 | Discharge: 2014-01-10 | Disposition: A | Discharge status: Home / Self Care | Payer: Medicaid Other | Source: Ambulatory Visit | Attending: Obstetrics and Gynecology | Admitting: Obstetrics and Gynecology

## 2014-01-10 ENCOUNTER — Encounter (HOSPITAL_COMMUNITY): Payer: Self-pay

## 2014-01-10 DIAGNOSIS — Z3201 Encounter for pregnancy test, result positive: Secondary | ICD-10-CM | POA: Insufficient documentation

## 2014-01-10 HISTORY — DX: Gestational diabetes mellitus in pregnancy, unspecified control: O24.419

## 2014-01-10 LAB — URINALYSIS, ROUTINE W REFLEX MICROSCOPIC
Bilirubin Urine: NEGATIVE
GLUCOSE, UA: NEGATIVE mg/dL
Hgb urine dipstick: NEGATIVE
KETONES UR: NEGATIVE mg/dL
LEUKOCYTES UA: NEGATIVE
NITRITE: NEGATIVE
PH: 6.5 (ref 5.0–8.0)
Protein, ur: NEGATIVE mg/dL
SPECIFIC GRAVITY, URINE: 1.015 (ref 1.005–1.030)
Urobilinogen, UA: 0.2 mg/dL (ref 0.0–1.0)

## 2014-01-10 LAB — POCT PREGNANCY, URINE: PREG TEST UR: POSITIVE — AB

## 2014-01-10 NOTE — Discharge Instructions (Signed)
Prenatal Care  °WHAT IS PRENATAL CARE?  °Prenatal care means health care during your pregnancy, before your baby is born. It is very important to take care of yourself and your baby during your pregnancy by:  °· Getting early prenatal care. If you know you are pregnant, or think you might be pregnant, call your health care provider as soon as possible. Schedule a visit for a prenatal exam. °· Getting regular prenatal care. Follow your health care provider's schedule for blood and other necessary tests. Do not miss appointments. °· Doing everything you can to keep yourself and your baby healthy during your pregnancy. °· Getting complete care. Prenatal care should include evaluation of the medical, dietary, educational, psychological, and social needs of you and your significant other. The medical and genetic history of your family and the family of your baby's father should be discussed with your health care provider. °· Discussing with your health care provider: °· Prescription, over-the-counter, and herbal medicines that you take. °· Any history of substance abuse, alcohol use, smoking, and illegal drug use. °· Any history of domestic abuse and violence. °· Immunizations you have received. °· Your nutrition and diet. °· The amount of exercise you do. °· Any environmental and occupational hazards to which you are exposed. °· History of sexually transmitted infections for both you and your partner. °· Previous pregnancies you have had. °WHY IS PRENATAL CARE SO IMPORTANT?  °By regularly seeing your health care provider, you help ensure that problems can be identified early so that they can be treated as soon as possible. Other problems might be prevented. Many studies have shown that early and regular prenatal care is important for the health of mothers and their babies.  °HOW CAN I TAKE CARE OF MYSELF WHILE I AM PREGNANT?  °Here are ways to take care of yourself and your baby:  °· Start or continue taking your  multivitamin with 400 micrograms (mcg) of folic acid every day. °· Get early and regular prenatal care. It is very important to see a health care provider during your pregnancy. Your health care provider will check at each visit to make sure that you and the baby are healthy. If there are any problems, action can be taken right away to help you and the baby. °· Eat a healthy diet that includes: °· Fruits. °· Vegetables. °· Foods low in saturated fat. °· Whole grains. °· Calcium-rich foods, such as milk, yogurt, and hard cheeses. °· Drink 6 to 8 glasses of liquids a day. °· Unless your health care provider tells you not to, try to be physically active for 30 minutes, most days of the week. If you are pressed for time, you can get your activity in through 10-minute segments, three times a day. °· Do not smoke, drink alcohol, or use drugs. These can cause long-term damage to your baby. Talk with your health care provider about steps to take to stop smoking. Talk with a member of your faith community, a counselor, a trusted friend, or your health care provider if you are concerned about your alcohol or drug use. °· Ask your health care provider before taking any medicine, even over-the-counter medicines. Some medicines are not safe to take during pregnancy. °· Get plenty of rest and sleep. °· Avoid hot tubs and saunas during pregnancy. °· Do not have X-rays taken unless absolutely necessary and with the recommendation of your health care provider. A lead shield can be placed on your abdomen to protect the   baby when X-rays are taken in other parts of the body. °· Do not empty the cat litter when you are pregnant. It may contain a parasite that causes an infection called toxoplasmosis, which can cause birth defects. Also, use gloves when working in garden areas used by cats. °· Do not eat uncooked or undercooked meats or fish. °· Do not eat soft, mold-ripened cheeses (Brie, Camembert, and chevre) or soft, blue-veined  cheese (Danish blue and Roquefort). °· Stay away from toxic chemicals like: °· Insecticides. °· Solvents (some cleaners or paint thinners). °· Lead. °· Mercury. °· Sexual intercourse may continue until the end of the pregnancy, unless you have a medical problem or there is a problem with the pregnancy and your health care provider tells you not to. °· Do not wear high-heel shoes, especially during the second half of the pregnancy. You can lose your balance and fall. °· Do not take long trips, unless absolutely necessary. Be sure to see your health care provider before going on the trip. °· Do not sit in one position for more than 2 hours when on a trip. °· Take a copy of your medical records when going on a trip. Know where a hospital is located in the city you are visiting, in case of an emergency. °· Most dangerous household products will have pregnancy warnings on their labels. Ask your health care provider about products if you are unsure. °· Limit or eliminate your caffeine intake from coffee, tea, sodas, medicines, and chocolate. °· Many women continue working through pregnancy. Staying active might help you stay healthier. If you have a question about the safety or the hours you work at your particular job, talk with your health care provider. °· Get informed: °· Read books. °· Watch videos. °· Go to childbirth classes for you and your significant other. °· Talk with experienced moms. °· Ask your health care provider about childbirth education classes for you and your partner. Classes can help you and your partner prepare for the birth of your baby. °· Ask about a baby doctor (pediatrician) and methods and pain medicine for labor, delivery, and possible cesarean delivery. °HOW OFTEN SHOULD I SEE MY HEALTH CARE PROVIDER DURING PREGNANCY?  °Your health care provider will give you a schedule for your prenatal visits. You will have visits more often as you get closer to the end of your pregnancy. An average  pregnancy lasts about 40 weeks.  °A typical schedule includes visiting your health care provider:  °· About once each month during your first 6 months of pregnancy. °· Every 2 weeks during the next 2 months. °· Weekly in the last month, until the delivery date. °Your health care provider will probably want to see you more often if: °· You are older than 35 years. °· Your pregnancy is high risk because you have certain health problems or problems with the pregnancy, such as: °· Diabetes. °· High blood pressure. °· The baby is not growing on schedule, according to the dates of the pregnancy. °Your health care provider will do special tests to make sure you and the baby are not having any serious problems. °WHAT HAPPENS DURING PRENATAL VISITS?  °· At your first prenatal visit, your health care provider will do a physical exam and talk to you about your health history and the health history of your partner and your family. Your health care provider will be able to tell you what date to expect your baby to be born on. °·   Your first physical exam will include checks of your blood pressure, measurements of your height and weight, and an exam of your pelvic organs. Your health care provider will do a Pap test if you have not had one recently and will do cultures of your cervix to make sure there is no infection. °· At each prenatal visit, there will be tests of your blood, urine, blood pressure, weight, and checking the progress of the baby. °· At your later prenatal visits, your health care provider will check how you are doing and how the baby is developing. You may have a number of tests done as your pregnancy progresses. °· Ultrasound exams are often used to check on the baby's growth and health. °· You may have more urine and blood tests, as well as special tests, if needed. These may include amniocentesis to examine fluid in the pregnancy sac, stress tests to check how the baby responds to contractions, or a  biophysical profile to measure fetus well-being. Your health care provider will explain the tests and why they are necessary. °· You should discuss with your health care provider your plans to breastfeed or bottle-feed your baby. °· Each visit is also a chance for you to learn about staying healthy during pregnancy and to ask questions. °Document Released: 07/25/2003 Document Revised: 05/12/2013 Document Reviewed: 01/07/2013 °Exit[employer]® Patient Information ©2014 [employer]. ° °

## 2014-01-10 NOTE — MAU Note (Signed)
Patient state she has had 3 pregnancy tests at home but the test states Inconclusive with 1/2 line. Patient states she has had back pain for several days. Denies bleeding, discharge, nausea or vomiting.

## 2014-01-10 NOTE — MAU Provider Note (Signed)
Attestation of Attending Supervision of Advanced Practitioner (CNM/NP): Evaluation and management procedures were performed by the Advanced Practitioner under my supervision and collaboration.  I have reviewed the Advanced Practitioner's note and chart, and I agree with the management and plan.  Muzamil Harker 01/10/2014 2:11 PM

## 2014-01-10 NOTE — MAU Provider Note (Signed)
Paula Joseph is a 28 y.o. [id number] at [redacted]w[redacted]d who presents to MAU today for confirmation of pregnancy. The patient states LMP approximately 11/17/13. She denies abdominal pain, vaginal bleeding, discharge, UTI symptoms or fever. She has had mild low back pain x weeks.   BP 117/97  Pulse 78  Resp 16  Ht 5' 3.5" (1.613 m)  Wt 160 lb 12.8 oz (72.938 kg)  BMI 28.03 kg/m2  SpO2 100%  LMP 11/17/2013 GENERAL: Well-developed, well-nourished female in no acute distress.  HEENT: Normocephalic, atraumatic.  LUNGS: Normal effort HEART: Regular rate  ABDOMEN: Soft, nontender, nondistended. No CVA tenderness MSK: No tenderness to palpation of the lower back, no edema noted EXTREMITIES: No cyanosis, clubbing, or edema  Results for orders placed during the hospital encounter of 01/10/14 (from the past 24 hour(s))  URINALYSIS, ROUTINE W REFLEX MICROSCOPIC     Status: Abnormal   Collection Time    01/10/14 10:45 AM      Result Value Ref Range   Color, Urine STRAW (*) YELLOW   APPearance CLEAR  CLEAR   Specific Gravity, Urine 1.015  1.005 - 1.030   pH 6.5  5.0 - 8.0   Glucose, UA NEGATIVE  NEGATIVE mg/dL   Hgb urine dipstick NEGATIVE  NEGATIVE   Bilirubin Urine NEGATIVE  NEGATIVE   Ketones, ur NEGATIVE  NEGATIVE mg/dL   Protein, ur NEGATIVE  NEGATIVE mg/dL   Urobilinogen, UA 0.2  0.0 - 1.0 mg/dL   Nitrite NEGATIVE  NEGATIVE   Leukocytes, UA NEGATIVE  NEGATIVE  POCT PREGNANCY, URINE     Status: Abnormal   Collection Time    01/10/14 10:51 AM      Result Value Ref Range   Preg Test, Ur POSITIVE (*) NEGATIVE   A: Positive pregnancy test  P: Discharge home First trimester warning signs discussed Confirmation of pregnancy letter and list of area OBs given. Patient declined referral to [facility] Patient may return to MAU as needed or if her condition were to change or worsen  Freddi Starr, PA-C 01/10/2014 11:24 AM

## 2014-06-06 ENCOUNTER — Encounter (HOSPITAL_COMMUNITY): Payer: Self-pay

## 2014-11-15 ENCOUNTER — Encounter (HOSPITAL_COMMUNITY): Payer: Self-pay | Admitting: *Deleted

## 2018-05-28 ENCOUNTER — Other Ambulatory Visit: Payer: Self-pay | Admitting: Hematology

## 2018-05-28 DIAGNOSIS — D509 Iron deficiency anemia, unspecified: Secondary | ICD-10-CM

## 2021-11-09 ENCOUNTER — Other Ambulatory Visit: Payer: Self-pay | Admitting: Hematology

## 2022-05-28 ENCOUNTER — Ambulatory Visit (HOSPITAL_COMMUNITY)
Admission: EM | Admit: 2022-05-28 | Discharge: 2022-05-28 | Disposition: A | Discharge status: Home / Self Care | Payer: Medicaid Other | Attending: Emergency Medicine | Admitting: Emergency Medicine

## 2022-05-28 ENCOUNTER — Encounter (HOSPITAL_COMMUNITY): Payer: Self-pay | Admitting: *Deleted

## 2022-05-28 ENCOUNTER — Other Ambulatory Visit: Payer: Self-pay

## 2022-05-28 DIAGNOSIS — R1030 Lower abdominal pain, unspecified: Secondary | ICD-10-CM

## 2022-05-28 LAB — POCT URINALYSIS DIPSTICK, ED / UC
Bilirubin Urine: NEGATIVE
Glucose, UA: NEGATIVE mg/dL
Hgb urine dipstick: NEGATIVE
Ketones, ur: NEGATIVE mg/dL
Leukocytes,Ua: NEGATIVE
Nitrite: NEGATIVE
Protein, ur: NEGATIVE mg/dL
Specific Gravity, Urine: 1.01 (ref 1.005–1.030)
Urobilinogen, UA: 0.2 mg/dL (ref 0.0–1.0)
pH: 6.5 (ref 5.0–8.0)

## 2022-05-28 LAB — POC URINE PREG, ED: Preg Test, Ur: NEGATIVE

## 2022-05-28 NOTE — ED Triage Notes (Signed)
Pt reports increased ABD pain . Pt has an appt on Oct.31 with her MD but her ABD pain has increased.

## 2022-05-28 NOTE — ED Provider Notes (Signed)
Provider Note  Patient Contact: 6:57 PM (approximate)   History   Abdominal Pain   HPI  Paula Joseph is a 36 y.o. female with a history of gestational diabetes and bronchitis, presents to the urgent care with lower abdominal discomfort.  Patient was seen on 05/15/2022 for generalized abdominal discomfort and had lab work done with reassuring results.  Patient states that she became more uncomfortable this evening and wanted to be checked out.  She describes her pain as being suprapubic in nature and radiating.  She denies dysuria or increased urinary frequency. No flank pain.      Physical Exam   Triage Vital Signs: ED Triage Vitals  Enc Vitals Group     BP 05/28/22 1844 129/87     Pulse Rate 05/28/22 1844 74     Resp 05/28/22 1844 18     Temp 05/28/22 1844 99.2 F (37.3 C)     Temp src --      SpO2 05/28/22 1844 99 %     Weight --      Height --      Head Circumference --      Peak Flow --      Pain Score 05/28/22 1842 9     Pain Loc --      Pain Edu? --      Excl. in GC? --     Most recent vital signs: Vitals:   05/28/22 1844  BP: 129/87  Pulse: 74  Resp: 18  Temp: 99.2 F (37.3 C)  SpO2: 99%     General: Alert and in no acute distress. Eyes:  PERRL. EOMI. Head: No acute traumatic findings ENT:      Nose: No congestion/rhinnorhea.      Mouth/Throat: Mucous membranes are moist. Neck: No stridor. No cervical spine tenderness to palpation. Cardiovascular:  Good peripheral perfusion Respiratory: Normal respiratory effort without tachypnea or retractions. Lungs CTAB. Good air entry to the bases with no decreased or absent breath sounds. Gastrointestinal: Bowel sounds 4 quadrants. Soft and nontender to palpation. No guarding or rigidity. No palpable masses. No distention. No CVA tenderness. Musculoskeletal: Full range of motion to all extremities.  Neurologic:  No gross focal neurologic deficits are appreciated.  Skin:   No rash noted    ED  Results / Procedures / Treatments   Labs (all labs ordered are listed, but only abnormal results are displayed) Labs Reviewed  POCT URINALYSIS DIPSTICK, ED / UC  POC URINE PREG, ED      PROCEDURES:  Critical Care performed: No  Procedures   MEDICATIONS ORDERED IN ED: Medications - No data to display   IMPRESSION / MDM / ASSESSMENT AND PLAN / ED COURSE  I reviewed the triage vital signs and the nursing notes.                              Assessment and plan Abdominal discomfort 36 year old female presents to the urgent care with lower abdominal discomfort I reviewed labs from 10/11 which were overall reassuring.  Patient had no signs of UTI on urinalysis and urine pregnancy test was negative.  [facility].  Cautioned patient that should read lower abdominal pain worsen to seek care at local emergency department.  Patient voiced understanding and feels comfortable with this plan.   FINAL CLINICAL IMPRESSION(S) / ED DIAGNOSES   Final diagnoses:  Lower  abdominal pain     Rx / DC Orders   ED Discharge Orders     None        Note:  This document was prepared using Dragon voice recognition software and may include unintentional dictation errors.   Vallarie Mare [place], [place] 05/28/22 1916
